# Patient Record
Sex: Male | Born: 2005 | Race: Asian | Hispanic: No | Marital: Single | Smoking: Never smoker
Health system: Southern US, Community
[De-identification: ages and names within clinical notes are randomized; demographics above are authoritative.]

## PROBLEM LIST (undated history)

## (undated) DIAGNOSIS — L309 Dermatitis, unspecified: Secondary | ICD-10-CM

## (undated) DIAGNOSIS — T7840XA Allergy, unspecified, initial encounter: Secondary | ICD-10-CM

## (undated) HISTORY — DX: Dermatitis, unspecified: L30.9

## (undated) HISTORY — DX: Allergy, unspecified, initial encounter: T78.40XA

---
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
## 2024-07-02 ENCOUNTER — Ambulatory Visit: Admitting: Family Medicine
